# Patient Record
Sex: Male | Born: 1970 | Race: White | Hispanic: No | Marital: Married | Smoking: Never smoker
Health system: Southern US, Community
[De-identification: ages and names within clinical notes are randomized; demographics above are authoritative.]

## PROBLEM LIST (undated history)

## (undated) HISTORY — PX: HERNIA REPAIR: SHX51

---

## 2010-10-04 ENCOUNTER — Observation Stay: Payer: Self-pay | Admitting: Internal Medicine

## 2015-11-22 ENCOUNTER — Emergency Department: Payer: Commercial Managed Care - HMO

## 2015-11-22 ENCOUNTER — Emergency Department
Admission: EM | Admit: 2015-11-22 | Discharge: 2015-11-22 | Disposition: A | Discharge status: Home / Self Care | Payer: Commercial Managed Care - HMO | Attending: Emergency Medicine | Admitting: Emergency Medicine

## 2015-11-22 DIAGNOSIS — W2189XA Striking against or struck by other sports equipment, initial encounter: Secondary | ICD-10-CM | POA: Insufficient documentation

## 2015-11-22 DIAGNOSIS — S060X0A Concussion without loss of consciousness, initial encounter: Secondary | ICD-10-CM | POA: Diagnosis not present

## 2015-11-22 DIAGNOSIS — Y9353 Activity, golf: Secondary | ICD-10-CM | POA: Insufficient documentation

## 2015-11-22 DIAGNOSIS — S0990XA Unspecified injury of head, initial encounter: Secondary | ICD-10-CM | POA: Diagnosis present

## 2015-11-22 DIAGNOSIS — Y929 Unspecified place or not applicable: Secondary | ICD-10-CM | POA: Insufficient documentation

## 2015-11-22 DIAGNOSIS — Y999 Unspecified external cause status: Secondary | ICD-10-CM | POA: Insufficient documentation

## 2015-11-22 MED ORDER — PROCHLORPERAZINE MALEATE 10 MG PO TABS
10.0000 mg | ORAL_TABLET | Freq: Once | ORAL | Status: AC
  Administered 2015-11-22: 10 mg via ORAL
  Filled 2015-11-22: qty 1
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
## 2015-11-22 NOTE — ED Notes (Signed)
Pt. Verbalizes understanding of d/c instructions, and follow-up. VS stable and pain controlled per pt.  Pt. In NAD at time of d/c and denies further concerns regarding this visit. Pt. Stable at the time of departure from the unit, departing unit by the safest and most appropriate manner per that pt condition and limitations. Pt advised to return to the ED at any time for emergent concerns, or for new/worsening symptoms.   

## 2015-11-22 NOTE — ED Notes (Signed)
Pt. Reports Rt. Sided weakness, light sensitive but denies vision changes
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
## 2016-05-09 DIAGNOSIS — R5383 Other fatigue: Secondary | ICD-10-CM | POA: Diagnosis not present

## 2016-05-09 DIAGNOSIS — J309 Allergic rhinitis, unspecified: Secondary | ICD-10-CM | POA: Diagnosis not present

## 2016-05-09 DIAGNOSIS — E538 Deficiency of other specified B group vitamins: Secondary | ICD-10-CM | POA: Diagnosis not present

## 2016-05-09 DIAGNOSIS — R5381 Other malaise: Secondary | ICD-10-CM | POA: Diagnosis not present

## 2016-05-09 DIAGNOSIS — R05 Cough: Secondary | ICD-10-CM | POA: Diagnosis not present

## 2016-05-19 DIAGNOSIS — J301 Allergic rhinitis due to pollen: Secondary | ICD-10-CM | POA: Diagnosis not present

## 2016-05-19 DIAGNOSIS — K219 Gastro-esophageal reflux disease without esophagitis: Secondary | ICD-10-CM | POA: Diagnosis not present

## 2016-05-19 DIAGNOSIS — R05 Cough: Secondary | ICD-10-CM | POA: Diagnosis not present

## 2016-08-02 DIAGNOSIS — M791 Myalgia: Secondary | ICD-10-CM | POA: Diagnosis not present

## 2016-08-02 DIAGNOSIS — R509 Fever, unspecified: Secondary | ICD-10-CM | POA: Diagnosis not present

## 2016-11-06 DIAGNOSIS — E785 Hyperlipidemia, unspecified: Secondary | ICD-10-CM | POA: Diagnosis not present

## 2016-11-06 DIAGNOSIS — Z0000 Encounter for general adult medical examination without abnormal findings: Secondary | ICD-10-CM | POA: Diagnosis not present

## 2016-12-21 DIAGNOSIS — Z23 Encounter for immunization: Secondary | ICD-10-CM | POA: Diagnosis not present

## 2017-05-04 DIAGNOSIS — J0100 Acute maxillary sinusitis, unspecified: Secondary | ICD-10-CM | POA: Diagnosis not present

## 2017-05-19 ENCOUNTER — Emergency Department: Payer: 59

## 2017-05-19 ENCOUNTER — Emergency Department
Admission: EM | Admit: 2017-05-19 | Discharge: 2017-05-19 | Disposition: A | Discharge status: Home / Self Care | Payer: 59 | Attending: Emergency Medicine | Admitting: Emergency Medicine

## 2017-05-19 ENCOUNTER — Other Ambulatory Visit: Payer: Self-pay

## 2017-05-19 DIAGNOSIS — Y9301 Activity, walking, marching and hiking: Secondary | ICD-10-CM | POA: Diagnosis not present

## 2017-05-19 DIAGNOSIS — Y929 Unspecified place or not applicable: Secondary | ICD-10-CM | POA: Insufficient documentation

## 2017-05-19 DIAGNOSIS — Y999 Unspecified external cause status: Secondary | ICD-10-CM | POA: Insufficient documentation

## 2017-05-19 DIAGNOSIS — S3992XA Unspecified injury of lower back, initial encounter: Secondary | ICD-10-CM | POA: Diagnosis present

## 2017-05-19 DIAGNOSIS — M25552 Pain in left hip: Secondary | ICD-10-CM | POA: Insufficient documentation

## 2017-05-19 DIAGNOSIS — S300XXA Contusion of lower back and pelvis, initial encounter: Secondary | ICD-10-CM | POA: Diagnosis not present

## 2017-05-19 DIAGNOSIS — S79912A Unspecified injury of left hip, initial encounter: Secondary | ICD-10-CM | POA: Diagnosis not present

## 2017-05-19 DIAGNOSIS — W19XXXA Unspecified fall, initial encounter: Secondary | ICD-10-CM

## 2017-05-19 DIAGNOSIS — W108XXA Fall (on) (from) other stairs and steps, initial encounter: Secondary | ICD-10-CM | POA: Insufficient documentation

## 2017-05-19 LAB — URINALYSIS, COMPLETE (UACMP) WITH MICROSCOPIC
BACTERIA UA: NONE SEEN
Bilirubin Urine: NEGATIVE
Glucose, UA: NEGATIVE mg/dL
Hgb urine dipstick: NEGATIVE
Ketones, ur: NEGATIVE mg/dL
Leukocytes, UA: NEGATIVE
NITRITE: NEGATIVE
Protein, ur: NEGATIVE mg/dL
RBC / HPF: NONE SEEN RBC/hpf (ref 0–5)
SPECIFIC GRAVITY, URINE: 1.02 (ref 1.005–1.030)
SQUAMOUS EPITHELIAL / LPF: NONE SEEN
pH: 5 (ref 5.0–8.0)

## 2017-05-19 MED ORDER — HYDROCODONE-ACETAMINOPHEN 5-325 MG PO TABS: 1.0000 | ORAL_TABLET | ORAL | 0 refills | Status: DC | PRN

## 2017-05-19 MED ORDER — METHOCARBAMOL 500 MG PO TABS: 500.0000 mg | ORAL_TABLET | Freq: Four times a day (QID) | ORAL | 0 refills | Status: AC

## 2017-05-19 MED ORDER — MELOXICAM 15 MG PO TABS: 15.0000 mg | ORAL_TABLET | Freq: Every day | ORAL | 0 refills | Status: AC

## 2017-05-19 MED ORDER — OXYCODONE-ACETAMINOPHEN 5-325 MG PO TABS
1.0000 | ORAL_TABLET | Freq: Once | ORAL | Status: AC
  Administered 2017-05-19: 1 via ORAL
  Filled 2017-05-19: qty 1

## 2017-05-19 MED ORDER — KETOROLAC TROMETHAMINE 30 MG/ML IJ SOLN
30.0000 mg | Freq: Once | INTRAMUSCULAR | Status: AC
  Administered 2017-05-19: 30 mg via INTRAMUSCULAR
  Filled 2017-05-19: qty 1

## 2017-05-19 MED ORDER — ONDANSETRON 8 MG PO TBDP
8.0000 mg | ORAL_TABLET | Freq: Once | ORAL | Status: AC
  Administered 2017-05-19: 8 mg via ORAL
  Filled 2017-05-19: qty 1

## 2017-05-19 MED ORDER — METHOCARBAMOL 500 MG PO TABS
1000.0000 mg | ORAL_TABLET | Freq: Once | ORAL | Status: AC
  Administered 2017-05-19: 1000 mg via ORAL
  Filled 2017-05-19: qty 2

## 2017-05-19 NOTE — ED Triage Notes (Signed)
Pt arrives to ED ambulatory. Fell on wooden steps earlier today and hit lower back. Denies hitting head, denies LOC. Denies blood thinner use. Appears uncomfortable. Unsure if hit pelvis.

## 2017-05-19 NOTE — ED Notes (Signed)
Pt discharged to home.  Family member driving.  Discharge instructions reviewed.  Verbalized understanding.  No questions or concerns at this time.  Teach back verified.  Pt in NAD.  No items left in ED.   

## 2017-05-19 NOTE — ED Provider Notes (Signed)
[facility] Emergency Department Provider Note  ____________________________________________  Time seen: Approximately 5:30 PM  I have reviewed the triage vital signs and the nursing notes.   HISTORY  Chief Complaint Fall    HPI Brian Perez is a 47 y.o. male who presents the emergency department complaining of lower back and left hip pain status post a fall.  Patient was coming down a flight of wooden stairs when he slipped and landed on his lower back and left hip.  At this time, patient reports sharp pain to the lower back and left hip but denies any radicular symptoms.  No bowel or bladder dysfunction, saddle anesthesia, paresthesias.  Patient did not hit his head or lose consciousness.  Patient denies any urinary retention symptoms.  He denies any hematuria.  No medications for this complaint prior to arrival.  No history of previous back injuries.  History reviewed. No pertinent past medical history.  There are no active problems to display for this patient.   Past Surgical History:  Procedure Laterality Date  . HERNIA REPAIR      Prior to Admission medications   Medication Sig Start Date End Date Taking? Authorizing Provider  HYDROcodone-acetaminophen (NORCO/VICODIN) 5-325 MG tablet Take 1 tablet by mouth every 4 (four) hours as needed for moderate pain. 05/19/17   Cuthriell, Charline Bills, PA-C  meloxicam (MOBIC) 15 MG tablet Take 1 tablet (15 mg total) by mouth daily. 05/19/17   Cuthriell, Charline Bills, PA-C  methocarbamol (ROBAXIN) 500 MG tablet Take 1 tablet (500 mg total) by mouth 4 (four) times daily. 05/19/17   Cuthriell, Charline Bills, PA-C    Allergies Patient has no known allergies.  History reviewed. No pertinent family history.  Social History Social History   Tobacco Use  . Smoking status: Never Smoker  Substance Use Topics  . Alcohol use: No    Frequency: Never  . Drug use: Not on file     Review of Systems  Constitutional: No  fever/chills Eyes: No visual changes. No discharge ENT: No upper respiratory complaints. Cardiovascular: no chest pain. Respiratory: no cough. No SOB. Gastrointestinal: No abdominal pain.  No nausea, no vomiting.  No diarrhea.  No constipation. Genitourinary: Negative for dysuria. No hematuria Musculoskeletal: Positive for lower back pain and left hip pain. Skin: Negative for rash, abrasions, lacerations, ecchymosis. Neurological: Negative for headaches, focal weakness or numbness. 10-point ROS otherwise negative.  ____________________________________________   PHYSICAL EXAM:  VITAL SIGNS: ED Triage Vitals  Enc Vitals Group     BP 05/19/17 1504 (!) 130/91     Pulse Rate 05/19/17 1504 (!) 107     Resp 05/19/17 1504 18     Temp 05/19/17 1504 97.9 F (36.6 C)     Temp Source 05/19/17 1504 Oral     SpO2 05/19/17 1504 95 %     Weight 05/19/17 1505 182 lb (82.6 kg)     Height 05/19/17 1505 5\' 10"  (1.778 m)     Head Circumference --      Peak Flow --      Pain Score 05/19/17 1504 10     Pain Loc --      Pain Edu? --      Excl. in [place]? --      Constitutional: Alert and oriented. Well appearing and in no acute distress. Eyes: Conjunctivae are normal. PERRL. EOMI. Head: Atraumatic. Neck: No stridor.  No cervical spine tenderness to palpation. Cardiovascular: Normal rate, regular rhythm. Normal S1 and S2.  Good peripheral circulation. Respiratory: Normal respiratory effort without tachypnea or retractions. Lungs CTAB. Good air entry to the bases with no decreased or absent breath sounds. Musculoskeletal: Full range of motion to all extremities. No gross deformities appreciated.  No deformity, abrasion, laceration, hematoma noted to the lower spine region.  Full range of motion to the lumbar spine.  Diffuse tenderness to palpation midline and and left side paraspinal muscle region.  No palpable abnormality or step-off.  Patient is tender to palpation along the iliac crest and over the  greater trochanteric region with no palpable abnormality.  Full range of motion to the left hip, left knee, left ankle joint.  Dorsalis pedis pulse intact bilateral lower extremity bilateral lower extremities.  Negative straight leg raise bilaterally. Neurologic:  Normal speech and language. No gross focal neurologic deficits are appreciated.  Skin:  Skin is warm, dry and intact. No rash noted. Psychiatric: Mood and affect are normal. Speech and behavior are normal. Patient exhibits appropriate insight and judgement.   ____________________________________________   LABS (all labs ordered are listed, but only abnormal results are displayed)  Labs Reviewed  URINALYSIS, COMPLETE (UACMP) WITH MICROSCOPIC - Abnormal; Notable for the following components:      Result Value   Color, Urine YELLOW (*)    APPearance CLEAR (*)    All other components within normal limits   ____________________________________________  EKG   ____________________________________________  RADIOLOGY Diamantina Providence Cuthriell, personally viewed and evaluated these images (plain radiographs) as part of my medical decision making, as well as reviewing the written report by the radiologist.  Dg Lumbar Spine Complete  Result Date: 05/19/2017 CLINICAL DATA:  Fall onto lower back EXAM: LUMBAR SPINE - COMPLETE 4+ VIEW COMPARISON:  None. FINDINGS: Five lumbar-type vertebral bodies. Normal lumbar lordosis. No evidence of fracture or dislocation. Vertebral body heights are maintained. Mild degenerative changes at L5-S1. Bilateral pars defects are possible at this level, without anterolisthesis. IMPRESSION: Mild degenerative changes at L5-S1. Electronically Signed   By: Julian Hy M.D.   On: 05/19/2017 16:01   Dg Hip Unilat W Or Wo Pelvis 2-3 Views Left  Result Date: 05/19/2017 CLINICAL DATA:  Patient status post fall. Lower back pain. Left hip pain. EXAM: DG HIP (WITH OR WITHOUT PELVIS) 2-3V LEFT COMPARISON:  None.  FINDINGS: There is no evidence of hip fracture or dislocation. There is no evidence of arthropathy or other focal bone abnormality. IMPRESSION: No acute osseous abnormality. Electronically Signed   By: Lovey Newcomer M.D.   On: 05/19/2017 17:05    ____________________________________________    PROCEDURES  Procedure(s) performed:    Procedures    Medications  ketorolac (TORADOL) 30 MG/ML injection 30 mg (not administered)  methocarbamol (ROBAXIN) tablet 1,000 mg (not administered)  oxyCODONE-acetaminophen (PERCOCET/ROXICET) 5-325 MG per tablet 1 tablet (1 tablet Oral Given 05/19/17 1656)  ondansetron (ZOFRAN-ODT) disintegrating tablet 8 mg (8 mg Oral Given 05/19/17 1656)     ____________________________________________   INITIAL IMPRESSION / ASSESSMENT AND PLAN / ED COURSE  Pertinent labs & imaging results that were available during my care of the patient were reviewed by me and considered in my medical decision making (see chart for details).  Review of the [place] CSRS was performed in accordance of the [place] prior to dispensing any controlled drugs.     Patient's diagnosis is consistent with fall resulting in lumbar contusion.  Initial differential included contusion, fractures, ruptured disc.  Patient's exam was overall reassuring.  Imaging reveals no acute osseous abnormality consistent  with fractures.  At this time, no indication for further workup.. Patient will be discharged home with prescriptions for meloxicam, Robaxin, #12 Vicodin. Patient is to follow up with primary care as needed or otherwise directed. Patient is given ED precautions to return to the ED for any worsening or new symptoms.     ____________________________________________  FINAL CLINICAL IMPRESSION(S) / ED DIAGNOSES  Final diagnoses:  Fall, initial encounter  Lumbar contusion, initial encounter      NEW MEDICATIONS STARTED DURING THIS VISIT:  ED Discharge Orders        Ordered    meloxicam  (MOBIC) 15 MG tablet  Daily     05/19/17 1759    methocarbamol (ROBAXIN) 500 MG tablet  4 times daily     05/19/17 1759    HYDROcodone-acetaminophen (NORCO/VICODIN) 5-325 MG tablet  Every 4 hours PRN     05/19/17 1759          This chart was dictated using voice recognition software/Dragon. Despite best efforts to proofread, errors can occur which can change the meaning. Any change was purely unintentional.    Darletta Moll, PA-C 05/19/17 1802    Carrie Mew, MD 05/20/17 [phone]

## 2017-07-10 DIAGNOSIS — R066 Hiccough: Secondary | ICD-10-CM | POA: Diagnosis not present

## 2017-11-05 ENCOUNTER — Emergency Department
Admission: EM | Admit: 2017-11-05 | Discharge: 2017-11-05 | Disposition: A | Discharge status: Home / Self Care | Payer: Commercial Managed Care - HMO | Attending: Emergency Medicine | Admitting: Emergency Medicine

## 2017-11-05 ENCOUNTER — Emergency Department: Payer: Commercial Managed Care - HMO

## 2017-11-05 ENCOUNTER — Other Ambulatory Visit: Payer: Self-pay

## 2017-11-05 ENCOUNTER — Encounter: Payer: Self-pay | Admitting: Emergency Medicine

## 2017-11-05 DIAGNOSIS — Z13220 Encounter for screening for lipoid disorders: Secondary | ICD-10-CM | POA: Diagnosis not present

## 2017-11-05 DIAGNOSIS — R42 Dizziness and giddiness: Secondary | ICD-10-CM | POA: Insufficient documentation

## 2017-11-05 DIAGNOSIS — Z79899 Other long term (current) drug therapy: Secondary | ICD-10-CM | POA: Insufficient documentation

## 2017-11-05 DIAGNOSIS — R51 Headache: Secondary | ICD-10-CM

## 2017-11-05 DIAGNOSIS — H538 Other visual disturbances: Secondary | ICD-10-CM | POA: Insufficient documentation

## 2017-11-05 DIAGNOSIS — G44219 Episodic tension-type headache, not intractable: Secondary | ICD-10-CM | POA: Insufficient documentation

## 2017-11-05 DIAGNOSIS — J0100 Acute maxillary sinusitis, unspecified: Secondary | ICD-10-CM

## 2017-11-05 DIAGNOSIS — R519 Headache, unspecified: Secondary | ICD-10-CM

## 2017-11-05 DIAGNOSIS — Z130 Encounter for screening for diseases of the blood and blood-forming organs and certain disorders involving the immune mechanism: Secondary | ICD-10-CM | POA: Diagnosis not present

## 2017-11-05 DIAGNOSIS — G43109 Migraine with aura, not intractable, without status migrainosus: Secondary | ICD-10-CM | POA: Diagnosis not present

## 2017-11-05 LAB — CBC
HEMATOCRIT: 45.2 % (ref 40.0–52.0)
Hemoglobin: 15.8 g/dL (ref 13.0–18.0)
MCH: 29.1 pg (ref 26.0–34.0)
MCHC: 35 g/dL (ref 32.0–36.0)
MCV: 83.1 fL (ref 80.0–100.0)
Platelets: 238 10*3/uL (ref 150–440)
RBC: 5.44 MIL/uL (ref 4.40–5.90)
RDW: 13.7 % (ref 11.5–14.5)
WBC: 14.1 10*3/uL — ABNORMAL HIGH (ref 3.8–10.6)

## 2017-11-05 LAB — COMPREHENSIVE METABOLIC PANEL
ALK PHOS: 50 U/L (ref 38–126)
ALT: 39 U/L (ref 0–44)
ANION GAP: 6 (ref 5–15)
AST: 29 U/L (ref 15–41)
Albumin: 4.6 g/dL (ref 3.5–5.0)
BILIRUBIN TOTAL: 1.2 mg/dL (ref 0.3–1.2)
BUN: 13 mg/dL (ref 6–20)
CALCIUM: 9.3 mg/dL (ref 8.9–10.3)
CO2: 27 mmol/L (ref 22–32)
Chloride: 104 mmol/L (ref 98–111)
Creatinine, Ser: 1.02 mg/dL (ref 0.61–1.24)
Glucose, Bld: 120 mg/dL — ABNORMAL HIGH (ref 70–99)
POTASSIUM: 4 mmol/L (ref 3.5–5.1)
Sodium: 137 mmol/L (ref 135–145)
TOTAL PROTEIN: 7.8 g/dL (ref 6.5–8.1)

## 2017-11-05 MED ORDER — PROCHLORPERAZINE EDISYLATE 10 MG/2ML IJ SOLN
10.0000 mg | Freq: Once | INTRAMUSCULAR | Status: AC
  Administered 2017-11-05: 10 mg via INTRAVENOUS
  Filled 2017-11-05: qty 2

## 2017-11-05 MED ORDER — LORAZEPAM 2 MG/ML IJ SOLN
1.0000 mg | Freq: Once | INTRAMUSCULAR | Status: AC
  Administered 2017-11-05: 1 mg via INTRAVENOUS
  Filled 2017-11-05: qty 1

## 2017-11-05 MED ORDER — DEXAMETHASONE SODIUM PHOSPHATE 10 MG/ML IJ SOLN
10.0000 mg | Freq: Once | INTRAMUSCULAR | Status: AC
  Administered 2017-11-05: 10 mg via INTRAVENOUS
  Filled 2017-11-05: qty 1

## 2017-11-05 MED ORDER — MAGNESIUM SULFATE 2 GM/50ML IV SOLN
2.0000 g | Freq: Once | INTRAVENOUS | Status: AC
Start: 2017-11-05 — End: 2017-11-05
  Administered 2017-11-05: 2 g via INTRAVENOUS
  Filled 2017-11-05: qty 50

## 2017-11-05 MED ORDER — DIPHENHYDRAMINE HCL 50 MG/ML IJ SOLN
25.0000 mg | Freq: Once | INTRAMUSCULAR | Status: AC
  Administered 2017-11-05: 25 mg via INTRAVENOUS
  Filled 2017-11-05: qty 1

## 2017-11-05 NOTE — Discharge Instructions (Addendum)
I believe you had a migraine headache.  Please follow-up with Dr. Kary Kos as we discussed.  If you begin to get another headache at the very beginning of the headache you can take some Tylenol, 4 of the over-the-counter Motrin's and a cup of coffee or can of Coca-Cola.  Take these all at once.  Oftentimes I will be enough to get rid of a headache.  If the headaches come frequently or if this does not work please return to follow-up with Dr. Kary Kos again.  He may decide to send you to neurology if need be.  These return here at once if you develop a headache with fever or begin vomiting and cannot keep anything down.  Use over-the-counter salt water nasal spray 5 or 6 times a day to help shrink the swelling and help with sinuses.  You can also use an over-the-counter decongestant for 1 or 2 days.  If you are still having any pain or pressure in that area after a week or if you develop a fever redness or swelling please return here or see Dr. Kary Kos.

## 2017-11-05 NOTE — ED Triage Notes (Signed)
Pt wife reports yesterday the patient started seeing white spots and then got a headache. Pt reports took ibuprofen last pm and this am his headache was back. Pt followed up with [facility] in [place] today and was told all looked good but if something else happened to go to the ED. Pt reports he had another episode at work. Pt reports today he started with dizziness, NV and a stiff neck and he was seeing multiple colors not just white.

## 2017-11-05 NOTE — ED Notes (Signed)
Pt given Ativan and taken to MRI.

## 2017-11-05 NOTE — ED Triage Notes (Signed)
First Nurse Note:  C/o headache today and dizziness.  Patient is AAOx3. Skin warm and dry.  Ambulates with easy and steady gait.  NAD

## 2017-11-05 NOTE — ED Provider Notes (Signed)
[facility] Emergency Department Provider Note  ____________________________________________   First MD Initiated Contact with Patient 11/05/17 1621     (approximate)  I have reviewed the triage vital signs and the nursing notes.   HISTORY  Chief Complaint Headache    HPI Brian Perez is a 47 y.o. male she reports he had white spots in front of his eyes yesterday and then developed rapid onset of a headache mostly consist concentrated in the right temple.  This improved over the course of the night but today he began having kaleidoscope colored lights and then developed another headache headache has been waxing and waning during the course of the day.  He is also been having nausea and vomiting with it.  The headache is made somewhat worse by very bright lights he has not had any response to noise.  It improved overnight with sleep.  He has not had this before.  His mother does have a history of infrequent migraines.   History reviewed. No pertinent past medical history.  There are no active problems to display for this patient.   Past Surgical History:  Procedure Laterality Date  . HERNIA REPAIR      Prior to Admission medications   Medication Sig Start Date End Date Taking? Authorizing Provider  HYDROcodone-acetaminophen (NORCO/VICODIN) 5-325 MG tablet Take 1 tablet by mouth every 4 (four) hours as needed for moderate pain. 05/19/17   Cuthriell, Charline Bills, PA-C  meloxicam (MOBIC) 15 MG tablet Take 1 tablet (15 mg total) by mouth daily. 05/19/17   Cuthriell, Charline Bills, PA-C  methocarbamol (ROBAXIN) 500 MG tablet Take 1 tablet (500 mg total) by mouth 4 (four) times daily. 05/19/17   Cuthriell, Charline Bills, PA-C    Allergies Patient has no known allergies.  No family history on file.  Social History Social History   Tobacco Use  . Smoking status: Never Smoker  Substance Use Topics  . Alcohol use: No    Frequency: Never  . Drug use: Not on file     Review of Systems  Constitutional: No fever/chills Eyes: No visual changes. ENT: No sore throat. Cardiovascular: Denies chest pain. Respiratory: Denies shortness of breath. Gastrointestinal: No abdominal pain.  No nausea, no vomiting.  No diarrhea.  No constipation. Genitourinary: Negative for dysuria. Musculoskeletal: Negative for back pain. Skin: Negative for rash. Neurological: Negative for  focal weakness or numbness.   ____________________________________________   PHYSICAL EXAM:  VITAL SIGNS: ED Triage Vitals [11/05/17 1448]  Enc Vitals Group     BP 134/84     Pulse Rate 78     Resp 20     Temp 98.7 F (37.1 C)     Temp Source Oral     SpO2 98 %     Weight 194 lb (88 kg)     Height 5\' 10"  (1.778 m)     Head Circumference      Peak Flow      Pain Score 4     Pain Loc      Pain Edu?      Excl. in [place]?     Constitutional: Alert and oriented. Well appearing and in no acute distress. Eyes: Conjunctivae are normal. PERRL. EOMI. fundi are difficult to see but appear normal Head: Atraumatic.  No tenderness to palpation of the temples Nose: No congestion/rhinnorhea. Mouth/Throat: Mucous membranes are moist.  Oropharynx non-erythematous. Neck: No stridor. Cardiovascular: Normal rate, regular rhythm. Grossly normal heart sounds.  Good peripheral circulation.  Respiratory: Normal respiratory effort.  No retractions. Lungs CTAB. Gastrointestinal: Soft and nontender. No distention. No abdominal bruits. No CVA tenderness. Musculoskeletal: No lower extremity tenderness nor edema.  No joint effusions. Neurologic:  Normal speech and language. No gross focal neurologic deficits are appreciated.  Pacifically cranial nerves II through XII are intact of the visual fields were not checked finger-nose rapid alternating movements and hands and heel-to-shin are all normal motor strength is 5/5 throughout there is no numbness. Skin:  Skin is warm, dry and intact. No rash  noted. Psychiatric: Mood and affect are normal. Speech and behavior are normal.  ____________________________________________   LABS (all labs ordered are listed, but only abnormal results are displayed)  Labs Reviewed  CBC - Abnormal; Notable for the following components:      Result Value   WBC 14.1 (*)    All other components within normal limits  COMPREHENSIVE METABOLIC PANEL - Abnormal; Notable for the following components:   Glucose, Bld 120 (*)    All other components within normal limits   ____________________________________________  EKG  EKG read interpreted by me shows normal sinus rhythm rate of 92 left axis no acute ST-T wave changes ____________________________________________  RADIOLOGY  ED MD interpretation: MRI shows nothing but some mild sinusitis  Official radiology report(s): Mr [name] NI Contrast  Result Date: 11/05/2017 CLINICAL DATA:  48 y/o M; episodes of headache, dizziness, and visual disturbance. EXAM: MRI HEAD WITHOUT CONTRAST MRA HEAD WITHOUT CONTRAST TECHNIQUE: Multiplanar, multiecho pulse sequences of the brain and surrounding structures were obtained without intravenous contrast. Angiographic images of the head were obtained using MRA technique without contrast. COMPARISON:  11/22/2015 CT head FINDINGS: MRI HEAD FINDINGS Brain: No acute infarction, hemorrhage, hydrocephalus, extra-axial collection or mass lesion. Prominent retrocerebellar extra-axial space may represent a small arachnoid cyst or mega cisterna magna, no significant mass effect. Vascular: Normal flow voids. Skull and upper cervical spine: Normal marrow signal. Sinuses/Orbits: Mild left maxillary sinus mucosal thickening. Small right maxillary sinus mucous retention cyst. No additional signal abnormality of paranasal sinuses or mastoid air cells. Orbits are unremarkable. Other: None. MRA HEAD FINDINGS Internal carotid arteries:  Patent. Anterior cerebral arteries:  Patent. Middle cerebral  arteries: Patent. Anterior communicating artery: Patent. Posterior communicating arteries: Not identified, likely hypoplastic or absent. Posterior cerebral arteries:  Patent. Basilar artery:  Patent.  Lower basilar fenestration. Vertebral arteries:  Patent. No evidence of high-grade stenosis, large vessel occlusion, or aneurysm. IMPRESSION: 1. No acute intracranial abnormality. Unremarkable MRI of the brain. 2. Mild maxillary sinus disease. 3. Normal MRA of the head. Electronically Signed   By: Kristine Garbe M.D.   On: 11/05/2017 19:26   Mr Brain Wo Contrast  Result Date: 11/05/2017 CLINICAL DATA:  47 y/o M; episodes of headache, dizziness, and visual disturbance. EXAM: MRI HEAD WITHOUT CONTRAST MRA HEAD WITHOUT CONTRAST TECHNIQUE: Multiplanar, multiecho pulse sequences of the brain and surrounding structures were obtained without intravenous contrast. Angiographic images of the head were obtained using MRA technique without contrast. COMPARISON:  11/22/2015 CT head FINDINGS: MRI HEAD FINDINGS Brain: No acute infarction, hemorrhage, hydrocephalus, extra-axial collection or mass lesion. Prominent retrocerebellar extra-axial space may represent a small arachnoid cyst or mega cisterna magna, no significant mass effect. Vascular: Normal flow voids. Skull and upper cervical spine: Normal marrow signal. Sinuses/Orbits: Mild left maxillary sinus mucosal thickening. Small right maxillary sinus mucous retention cyst. No additional signal abnormality of paranasal sinuses or mastoid air cells. Orbits are unremarkable. Other: None. MRA HEAD FINDINGS Internal carotid  arteries:  Patent. Anterior cerebral arteries:  Patent. Middle cerebral arteries: Patent. Anterior communicating artery: Patent. Posterior communicating arteries: Not identified, likely hypoplastic or absent. Posterior cerebral arteries:  Patent. Basilar artery:  Patent.  Lower basilar fenestration. Vertebral arteries:  Patent. No evidence of  high-grade stenosis, large vessel occlusion, or aneurysm. IMPRESSION: 1. No acute intracranial abnormality. Unremarkable MRI of the brain. 2. Mild maxillary sinus disease. 3. Normal MRA of the head. Electronically Signed   By: Kristine Garbe M.D.   On: 11/05/2017 19:26    ____________________________________________   PROCEDURES  Procedure(s) performed:   Procedures  Critical Care performed:   ____________________________________________   INITIAL IMPRESSION / ASSESSMENT AND PLAN / ED COURSE Patient headache resolved.  Patient does have side effect with Compazine and cannot stop moving and feels jittery.  We will give him some Benadryl and some Ativan for the MRI.  MRI returns is essentially normal I discussed treatment of his sinusitis with over-the-counter decongestants and salt water nasal spray and he will return or see his doctor he is not well in about a week.  We can try antibiotics then.      Clinical Course as of Nov 06 1954  Mon Nov 05, 2017  1825 Calcium: 9.3 [PM]  1924 Albumin: 4.6 [PM]  1928 AST: 29 [PM]    Clinical Course User Index [PM] Nena Polio, MD     ____________________________________________   FINAL CLINICAL IMPRESSION(S) / ED DIAGNOSES  Final diagnoses:  Nonintractable episodic headache, unspecified headache type  Acute maxillary sinusitis, recurrence not specified     ED Discharge Orders    None       Note:  This document was prepared using Dragon voice recognition software and may include unintentional dictation errors.    Nena Polio, MD 11/05/17 [id number]

## 2017-11-06 DIAGNOSIS — G44009 Cluster headache syndrome, unspecified, not intractable: Secondary | ICD-10-CM | POA: Diagnosis not present

## 2017-11-09 DIAGNOSIS — E782 Mixed hyperlipidemia: Secondary | ICD-10-CM | POA: Diagnosis not present

## 2017-11-09 DIAGNOSIS — R7309 Other abnormal glucose: Secondary | ICD-10-CM | POA: Diagnosis not present

## 2017-11-09 DIAGNOSIS — Z0000 Encounter for general adult medical examination without abnormal findings: Secondary | ICD-10-CM | POA: Diagnosis not present

## 2018-01-12 DIAGNOSIS — Z23 Encounter for immunization: Secondary | ICD-10-CM | POA: Diagnosis not present

## 2019-03-11 ENCOUNTER — Ambulatory Visit: Payer: BC Managed Care – PPO | Attending: Internal Medicine

## 2019-03-11 DIAGNOSIS — Z20822 Contact with and (suspected) exposure to covid-19: Secondary | ICD-10-CM

## 2019-03-13 LAB — NOVEL CORONAVIRUS, NAA: SARS-CoV-2, NAA: NOT DETECTED

## 2019-11-30 IMAGING — MR MR MRA HEAD W/O CM
10 of 11 series · 30 of 48 positions shown · non-contrast
Comparison: 11/22/2015 CT head

CLINICAL DATA: 47 y/o M; episodes of headache, dizziness, and
visual disturbance.

EXAM:
MRI HEAD WITHOUT CONTRAST
MRA HEAD WITHOUT CONTRAST
TECHNIQUE: Multiplanar, multiecho pulse sequences of the brain and surrounding
structures were obtained without intravenous contrast. Angiographic
images of the head were obtained using MRA technique without
contrast.

[Series 2: ax dwi_tracew · axial · 3.0mm · 0.83mm/px · z∈[-76,+84]mm · 6 of 55 slices shown]
[im 1/55]
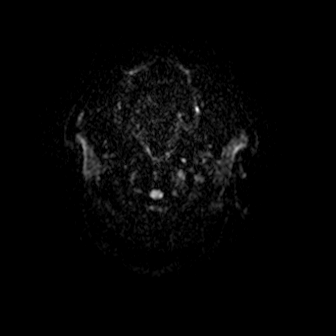
[im 11/55]
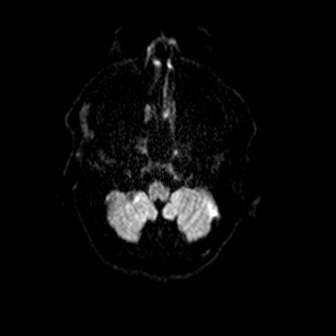
[im 22/55]
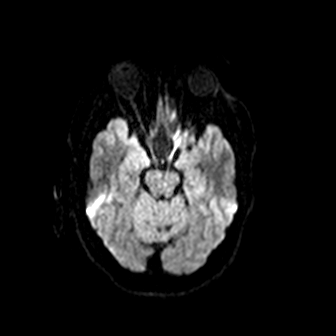
[im 33/55]
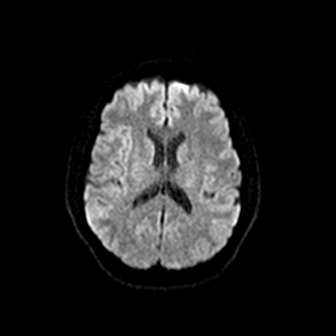
[im 44/55]
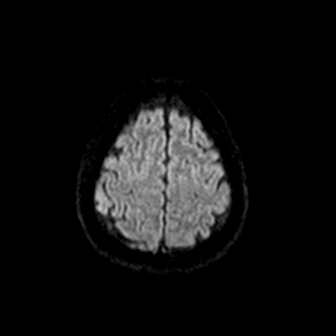
[im 55/55]
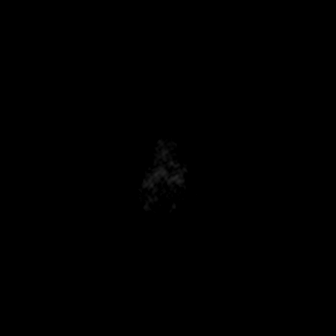

[Series 3: ax dwi_adc · axial · 3.0mm · 0.83mm/px · z∈[-76,+84]mm · 5 of 55 slices shown]
[im 1/55]
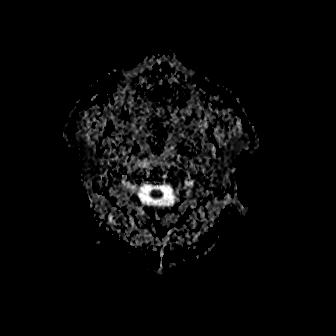
[im 14/55]
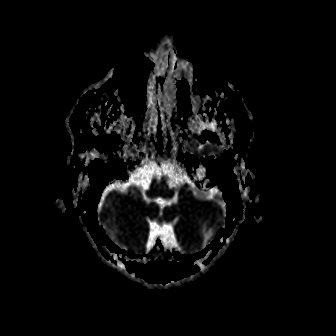
[im 28/55]
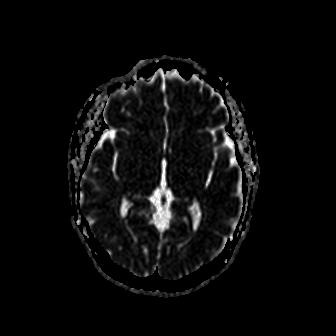
[im 41/55]
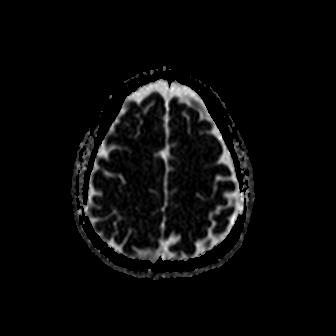
[im 55/55]
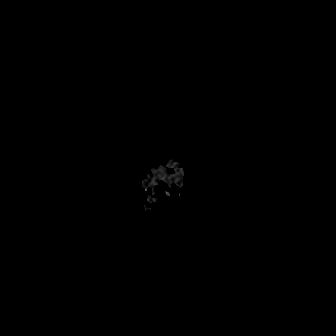

[Series 4: cor dwi_tracew · coronal · 5.0mm · 0.68mm/px · 3 of 41 slices shown]
[im 1/41]
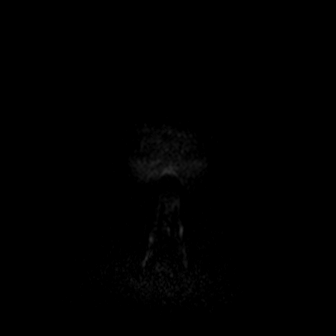
[im 21/41]
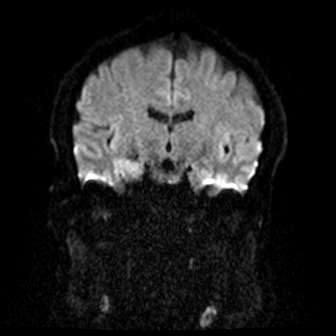
[im 41/41]
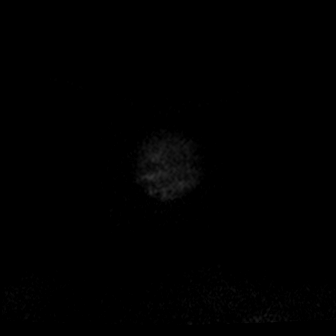

[Series 5: cor dwi_adc · coronal · 5.0mm · 0.68mm/px · 3 of 41 slices shown]
[im 1/41]
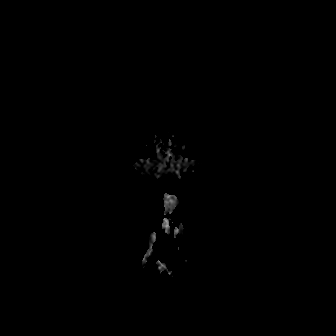
[im 21/41]
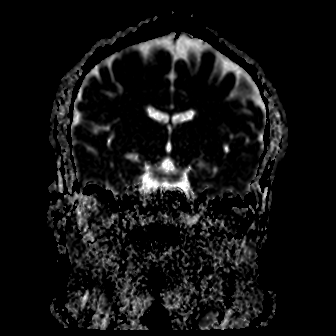
[im 41/41]
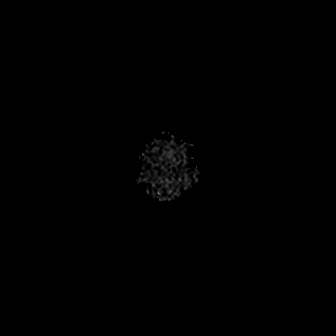

[Series 11: FLAIR · axial · 5.0mm · 1.20mm/px · z∈[-71,+84]mm · 2 of 27 slices shown]
[im 1/27]
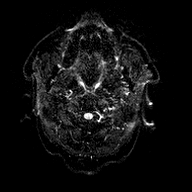
[im 27/27]
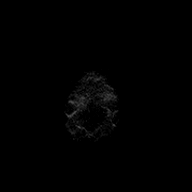

[Series 12: T2-star · axial · 5.0mm · 0.45mm/px · z∈[-71,+84]mm · 2 of 27 slices shown]
[im 1/27]
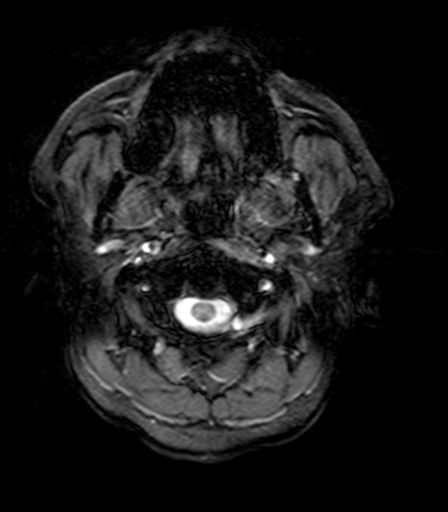
[im 27/27]
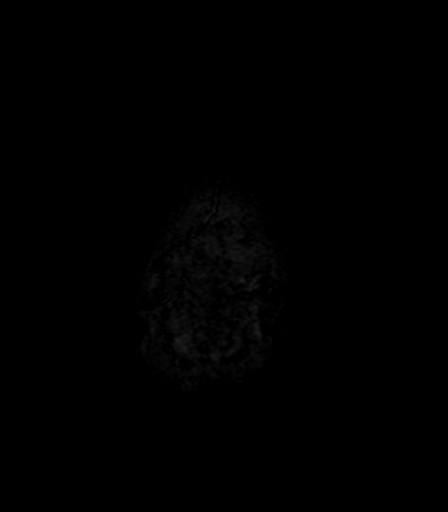

[Series 13: T2 · axial · 5.0mm · 0.45mm/px · z∈[-71,+84]mm · 2 of 27 slices shown (1 of 2)]
[im 1/27]
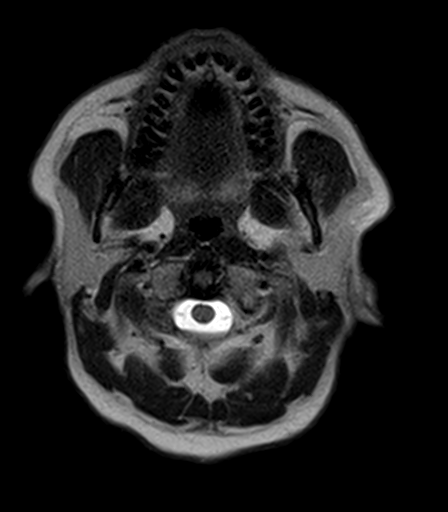
[im 27/27]
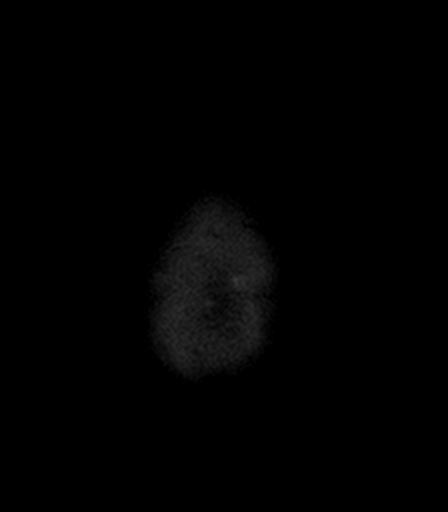

[Series 14: T1 · sagittal · 5.0mm · 0.94mm/px · 2 of 23 slices shown (1 of 2)]
[im 1/23]
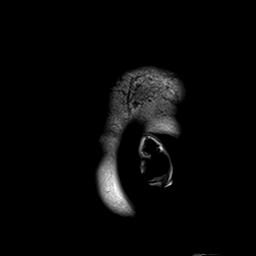
[im 23/23]
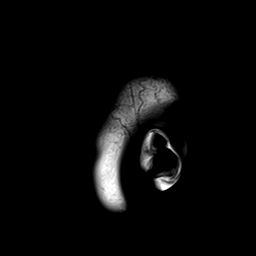

[Series 15: T1 · axial · 5.0mm · 0.90mm/px · z∈[-71,+84]mm · 2 of 27 slices shown (2 of 2)]
[im 1/27]
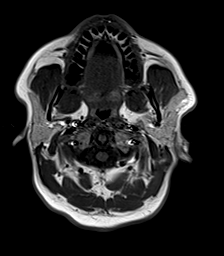
[im 27/27]
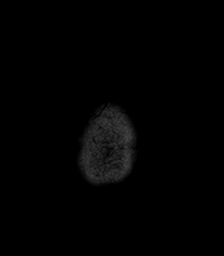

[Series 16: T2 · coronal · 5.0mm · 0.45mm/px · 3 of 31 slices shown (2 of 2)]
[im 1/31]
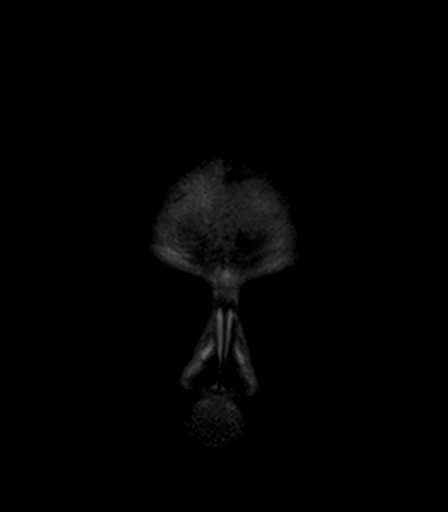
[im 16/31]
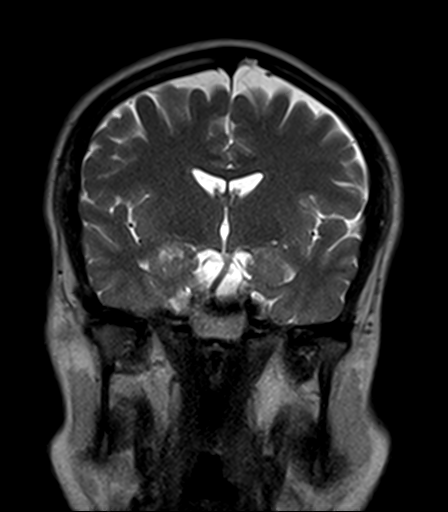
[im 31/31]
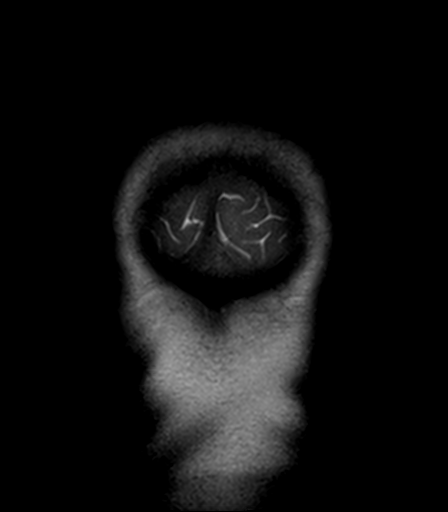

[30 of 48 positions shown; findings below may reference images not displayed]

FINDINGS: MRI HEAD FINDINGS

Brain: No acute infarction, hemorrhage, hydrocephalus, extra-axial
collection or mass lesion. Prominent retrocerebellar extra-axial
space may represent a small arachnoid cyst or mega cisterna magna,
no significant mass effect.

Vascular: Normal flow voids.

Skull and upper cervical spine: Normal marrow signal.

Sinuses/Orbits: Mild left maxillary sinus mucosal thickening. Small
right maxillary sinus mucous retention cyst. No additional signal
abnormality of paranasal sinuses or mastoid air cells. Orbits are
unremarkable.

Other: None.

MRA HEAD FINDINGS

Internal carotid arteries:  Patent.

Anterior cerebral arteries:  Patent.

Middle cerebral arteries: Patent.

Anterior communicating artery: Patent.

Posterior communicating arteries: Not identified, likely hypoplastic
or absent.

Posterior cerebral arteries:  Patent.

Basilar artery:  Patent.  Lower basilar fenestration.

Vertebral arteries:  Patent.

No evidence of high-grade stenosis, large vessel occlusion, or
aneurysm.
IMPRESSION: 1. No acute intracranial abnormality. Unremarkable MRI of the brain.
2. Mild maxillary sinus disease.
3. Normal MRA of the head.

By: Brendz Gepte M.D.

## 2020-11-26 ENCOUNTER — Emergency Department
Admission: EM | Admit: 2020-11-26 | Discharge: 2020-11-26 | Disposition: A | Discharge status: Home / Self Care | Payer: Managed Care, Other (non HMO) | Attending: Emergency Medicine | Admitting: Emergency Medicine

## 2020-11-26 ENCOUNTER — Encounter: Payer: Self-pay | Admitting: Emergency Medicine

## 2020-11-26 ENCOUNTER — Other Ambulatory Visit: Payer: Self-pay

## 2020-11-26 DIAGNOSIS — Y9289 Other specified places as the place of occurrence of the external cause: Secondary | ICD-10-CM | POA: Insufficient documentation

## 2020-11-26 DIAGNOSIS — Z23 Encounter for immunization: Secondary | ICD-10-CM | POA: Diagnosis not present

## 2020-11-26 DIAGNOSIS — Y9389 Activity, other specified: Secondary | ICD-10-CM | POA: Diagnosis not present

## 2020-11-26 DIAGNOSIS — Y990 Civilian activity done for income or pay: Secondary | ICD-10-CM | POA: Insufficient documentation

## 2020-11-26 DIAGNOSIS — W268XXA Contact with other sharp object(s), not elsewhere classified, initial encounter: Secondary | ICD-10-CM | POA: Insufficient documentation

## 2020-11-26 DIAGNOSIS — S60941A Unspecified superficial injury of left index finger, initial encounter: Secondary | ICD-10-CM | POA: Diagnosis present

## 2020-11-26 DIAGNOSIS — S61211A Laceration without foreign body of left index finger without damage to nail, initial encounter: Secondary | ICD-10-CM | POA: Insufficient documentation

## 2020-11-26 MED ORDER — LIDOCAINE HCL (PF) 1 % IJ SOLN
5.0000 mL | Freq: Once | INTRAMUSCULAR | Status: AC
  Administered 2020-11-26: 5 mL

## 2020-11-26 MED ORDER — TETANUS-DIPHTH-ACELL PERTUSSIS 5-2.5-18.5 LF-MCG/0.5 IM SUSY
0.5000 mL | PREFILLED_SYRINGE | Freq: Once | INTRAMUSCULAR | Status: AC
  Administered 2020-11-26: 0.5 mL via INTRAMUSCULAR
  Filled 2020-11-26: qty 0.5

## 2020-11-26 NOTE — ED Triage Notes (Signed)
Pt reports cut his left hand index finger with a razor blade about an hour ago. Pt reports he got it to stop bleeding but then he removed the bandage to put some neosporin on it and the bleeding started back

## 2020-11-26 NOTE — ED Provider Notes (Signed)
[facility] Emergency Department Provider Note  ____________________________________________   Event Date/Time   First MD Initiated Contact with Patient 11/26/20 1132     (approximate)  I have reviewed the triage vital signs and the nursing notes.   HISTORY  Chief Complaint Laceration   HPI Brian Perez is a 50 y.o. male without significant past medical history who presents for assessment of a laceration sustained to his left index finger while working prior to arrival.  Patient states he cut it with a razor he was using to scrape something.  He denies any other injuries or other acute complaints including fevers, chills, cough, nausea, vomiting, diarrhea, dysuria, rash or any other recent injuries or falls.  He denies any other cuts to his hands and is not sure when his last tetanus shot was.  No other acute concerns at this time.         History reviewed. No pertinent past medical history.  There are no problems to display for this patient.   Past Surgical History:  Procedure Laterality Date   HERNIA REPAIR      Prior to Admission medications   Medication Sig Start Date End Date Taking? Authorizing Provider  HYDROcodone-acetaminophen (NORCO/VICODIN) 5-325 MG tablet Take 1 tablet by mouth every 4 (four) hours as needed for moderate pain. 05/19/17   Cuthriell, Charline Bills, PA-C  meloxicam (MOBIC) 15 MG tablet Take 1 tablet (15 mg total) by mouth daily. 05/19/17   Cuthriell, Charline Bills, PA-C  methocarbamol (ROBAXIN) 500 MG tablet Take 1 tablet (500 mg total) by mouth 4 (four) times daily. 05/19/17   Cuthriell, Charline Bills, PA-C    Allergies Patient has no known allergies.  No family history on file.  Social History Social History   Tobacco Use   Smoking status: Never  Substance Use Topics   Alcohol use: No    Review of Systems  Review of Systems  Constitutional:  Negative for chills and fever.  HENT:  Negative for sore throat.   Eyes:   Negative for pain.  Respiratory:  Negative for cough and stridor.   Cardiovascular:  Negative for chest pain.  Gastrointestinal:  Negative for vomiting.  Genitourinary:  Negative for dysuria.  Musculoskeletal:  Positive for myalgias (L 2nd finger).  Skin:  Negative for rash.  Neurological:  Negative for seizures, loss of consciousness and headaches.  Psychiatric/Behavioral:  Negative for suicidal ideas.   All other systems reviewed and are negative.    ____________________________________________   PHYSICAL EXAM:  VITAL SIGNS: ED Triage Vitals  Enc Vitals Group     BP 11/26/20 1130 (!) 134/100     Pulse Rate 11/26/20 1130 87     Resp 11/26/20 1130 16     Temp 11/26/20 1130 97.7 F (36.5 C)     Temp Source 11/26/20 1130 Oral     SpO2 11/26/20 1130 99 %     Weight 11/26/20 1125 190 lb (86.2 kg)     Height 11/26/20 1125 '5\' 10"'$  (1.778 m)     Head Circumference --      Peak Flow --      Pain Score 11/26/20 1125 6     Pain Loc --      Pain Edu? --      Excl. in [place]? --    Vitals:   11/26/20 1130  BP: (!) 134/100  Pulse: 87  Resp: 16  Temp: 97.7 F (36.5 C)  SpO2: 99%   Physical Exam Vitals  and nursing note reviewed.  Constitutional:      Appearance: He is well-developed.  HENT:     Head: Normocephalic and atraumatic.     Right Ear: External ear normal.     Left Ear: External ear normal.     Nose: Nose normal.  Eyes:     Conjunctiva/sclera: Conjunctivae normal.  Cardiovascular:     Rate and Rhythm: Normal rate and regular rhythm.     Heart sounds: No murmur heard. Pulmonary:     Effort: Pulmonary effort is normal. No respiratory distress.  Abdominal:     General: There is no distension.     Palpations: Abdomen is soft.  Musculoskeletal:     Cervical back: Neck supple.  Skin:    General: Skin is warm and dry.  Neurological:     Mental Status: He is alert and oriented to person, place, and time.  Psychiatric:        Mood and Affect: Mood normal.     Approximately 1.5 cm linear laceration over the radial aspect of the left second digit extending from approximately the distal interphalangeal joint to the proximal interphalangeal joint.  Does not cross joint space on the volar or dorsal aspect.  Patient is able to flex and extend the digit against resistance of both these joints.  There is no other injury to this digit or the extremities.  Distal capillary refill in the second digit is less than 2 seconds. ____________________________________________   LABS (all labs ordered are listed, but only abnormal results are displayed)  Labs Reviewed - No data to display ____________________________________________  EKG   ____________________________________________  RADIOLOGY  ED MD interpretation:    Official radiology report(s): No results found.  ____________________________________________   PROCEDURES  Procedure(s) performed (including Critical Care):  Marland KitchenMarland KitchenLaceration Repair  Date/Time: 11/26/2020 12:29 PM Performed by: Lucrezia Starch, MD Authorized by: Lucrezia Starch, MD   Consent:    Consent obtained:  Verbal   Consent given by:  Patient   Risks, benefits, and alternatives were discussed: yes     Risks discussed:  Pain, infection and need for additional repair Universal protocol:    Procedure explained and questions answered to patient or proxy's satisfaction: yes   Laceration details:    Location:  Finger   Length (cm):  1.5 Exploration:    Limited defect created (wound extended): no     Hemostasis achieved with:  Tourniquet   Wound exploration: entire depth of wound visualized     Contaminated: no   Treatment:    Area cleansed with:  Soap and water   Amount of cleaning:  Extensive   Irrigation solution:  Tap water   Irrigation method:  Tap   Visualized foreign bodies/material removed: no     Debridement:  None   Undermining:  None   Scar revision: no   Skin repair:    Repair method:  Sutures   Suture  size:  5-0   Suture technique:  Simple interrupted   Number of sutures:  4 Approximation:    Approximation:  Close Repair type:    Repair type:  Simple Post-procedure details:    Dressing:  Adhesive bandage   Procedure completion:  Tolerated well, no immediate complications   ____________________________________________   INITIAL IMPRESSION / ASSESSMENT AND PLAN / ED COURSE      Patient presents with above to history exam for a laceration he sustained to his left second digit with a razor prior to arrival.  No other  injuries per patient.  He is afebrile hemodynamically stable arrival.  On exam is approximate 1.5 cm laceration that was repaired per procedure note above.  There is no evidence of tendon injury is able to flex and extend without difficulty.  His finger otherwise appears well perfused.  Suspicion for occult fracture or other significant injury.  Tetanus updated.  Discharged stable condition.  Strict return precautions advised and discussed.         ____________________________________________   FINAL CLINICAL IMPRESSION(S) / ED DIAGNOSES  Final diagnoses:  Laceration of left index finger without foreign body without damage to nail, initial encounter    Medications  lidocaine (PF) (XYLOCAINE) 1 % injection 5 mL (5 mLs Other Given by Other 11/26/20 1135)  Tdap (BOOSTRIX) injection 0.5 mL (0.5 mLs Intramuscular Given 11/26/20 1152)     ED Discharge Orders     None        Note:  This document was prepared using Dragon voice recognition software and may include unintentional dictation errors.    Lucrezia Starch, MD 11/26/20 1230

## 2022-01-18 ENCOUNTER — Ambulatory Visit: Payer: Managed Care, Other (non HMO) | Admitting: Dermatology

## 2022-01-18 DIAGNOSIS — D1723 Benign lipomatous neoplasm of skin and subcutaneous tissue of right leg: Secondary | ICD-10-CM

## 2022-01-18 DIAGNOSIS — D492 Neoplasm of unspecified behavior of bone, soft tissue, and skin: Secondary | ICD-10-CM

## 2022-01-18 NOTE — Patient Instructions (Addendum)
Wound Care Instructions  Cleanse wound gently with soap and water once a day then pat dry with clean gauze. Apply a thin coat of Petrolatum (petroleum jelly, "Vaseline") over the wound (unless you have an allergy to this). We recommend that you use a new, sterile tube of Vaseline. Do not pick or remove scabs. Do not remove the yellow or white "healing tissue" from the base of the wound.  Cover the wound with fresh, clean, nonstick gauze and secure with paper tape. You may use Band-Aids in place of gauze and tape if the wound is small enough, but would recommend trimming much of the tape off as there is often too much. Sometimes Band-Aids can irritate the skin.  You should call the office for your biopsy report after 1 week if you have not already been contacted.  If you experience any problems, such as abnormal amounts of bleeding, swelling, significant bruising, significant pain, or evidence of infection, please call the office immediately.  FOR ADULT SURGERY PATIENTS: If you need something for pain relief you may take 1 extra strength Tylenol (acetaminophen) AND 2 Ibuprofen (200mg each) together every 4 hours as needed for pain. (do not take these if you are allergic to them or if you have a reason you should not take them.) Typically, you may only need pain medication for 1 to 3 days.     Due to recent changes in healthcare laws, you may see results of your pathology and/or laboratory studies on MyChart before the doctors have had a chance to review them. We understand that in some cases there may be results that are confusing or concerning to you. Please understand that not all results are received at the same time and often the doctors may need to interpret multiple results in order to provide you with the best plan of care or course of treatment. Therefore, we ask that you please give us 2 business days to thoroughly review all your results before contacting the office for clarification. Should  we see a critical lab result, you will be contacted sooner.   If You Need Anything After Your Visit  If you have any questions or concerns for your doctor, please call our main line at [phone] and press option 4 to reach your doctor's medical assistant. If no one answers, please leave a voicemail as directed and we will return your call as soon as possible. Messages left after 4 pm will be answered the following business day.   You may also send us a message via MyChart. We typically respond to MyChart messages within 1-2 business days.  For prescription refills, please ask your pharmacy to contact our office. Our fax number is [phone].  If you have an urgent issue when the clinic is closed that cannot wait until the next business day, you can page your doctor at the number below.    Please note that while we do our best to be available for urgent issues outside of office hours, we are not available 24/7.   If you have an urgent issue and are unable to reach us, you may choose to seek medical care at your doctor's office, retail clinic, urgent care center, or emergency room.  If you have a medical emergency, please immediately call 911 or go to the emergency department.  Pager Numbers  - Dr. Kowalski: [phone]  - Dr. Moye: [phone]  - Dr. Stewart: [phone]  In the event of inclement weather, please call our main line at   [phone] for an update on the status of any delays or closures.  Dermatology Medication Tips: Please keep the boxes that topical medications come in in order to help keep track of the instructions about where and how to use these. Pharmacies typically print the medication instructions only on the boxes and not directly on the medication tubes.   If your medication is too expensive, please contact our office at [phone] option 4 or send us a message through MyChart.   We are unable to tell what your co-pay for medications will be in  advance as this is different depending on your insurance coverage. However, we may be able to find a substitute medication at lower cost or fill out paperwork to get insurance to cover a needed medication.   If a prior authorization is required to get your medication covered by your insurance company, please allow us 1-2 business days to complete this process.  Drug prices often vary depending on where the prescription is filled and some pharmacies may offer cheaper prices.  The website www.goodrx.com contains coupons for medications through different pharmacies. The prices here do not account for what the cost may be with help from insurance (it may be cheaper with your insurance), but the website can give you the price if you did not use any insurance.  - You can print the associated coupon and take it with your prescription to the pharmacy.  - You may also stop by our office during regular business hours and pick up a GoodRx coupon card.  - If you need your prescription sent electronically to a different pharmacy, notify our office through [facility] MyChart or by phone at [phone] option 4.     Si Usted Necesita Algo Despus de Su Visita  Tambin puede enviarnos un mensaje a travs de MyChart. Por lo general respondemos a los mensajes de MyChart en el transcurso de 1 a 2 das hbiles.  Para renovar recetas, por favor pida a su farmacia que se ponga en contacto con nuestra oficina. Nuestro nmero de fax es el [phone].  Si tiene un asunto urgente cuando la clnica est cerrada y que no puede esperar hasta el siguiente da hbil, puede llamar/localizar a su doctor(a) al nmero que aparece a continuacin.   Por favor, tenga en cuenta que aunque hacemos todo lo posible para estar disponibles para asuntos urgentes fuera del horario de oficina, no estamos disponibles las 24 horas del da, los 7 das de la semana.   Si tiene un problema urgente y no puede comunicarse con nosotros, puede  optar por buscar atencin mdica  en el consultorio de su doctor(a), en una clnica privada, en un centro de atencin urgente o en una sala de emergencias.  Si tiene una emergencia mdica, por favor llame inmediatamente al 911 o vaya a la sala de emergencias.  Nmeros de bper  - Dr. Kowalski: [phone]  - Dra. Moye: [phone]  - Dra. Stewart: [phone]  En caso de inclemencias del tiempo, por favor llame a nuestra lnea principal al [phone] para una actualizacin sobre el estado de cualquier retraso o cierre.  Consejos para la medicacin en dermatologa: Por favor, guarde las cajas en las que vienen los medicamentos de uso tpico para ayudarle a seguir las instrucciones sobre dnde y cmo usarlos. Las farmacias generalmente imprimen las instrucciones del medicamento slo en las cajas y no directamente en los tubos del medicamento.   Si su medicamento es muy caro, por favor, pngase en contacto con   nuestra oficina llamando al [phone] y presione la opcin 4 o envenos un mensaje a travs de MyChart.   No podemos decirle cul ser su copago por los medicamentos por adelantado ya que esto es diferente dependiendo de la cobertura de su seguro. Sin embargo, es posible que podamos encontrar un medicamento sustituto a menor costo o llenar un formulario para que el seguro cubra el medicamento que se considera necesario.   Si se requiere una autorizacin previa para que su compaa de seguros cubra su medicamento, por favor permtanos de 1 a 2 das hbiles para completar este proceso.  Los precios de los medicamentos varan con frecuencia dependiendo del lugar de dnde se surte la receta y alguna farmacias pueden ofrecer precios ms baratos.  El sitio web www.goodrx.com tiene cupones para medicamentos de diferentes farmacias. Los precios aqu no tienen en cuenta lo que podra costar con la ayuda del seguro (puede ser ms barato con su seguro), pero el sitio web puede darle el  precio si no utiliz ningn seguro.  - Puede imprimir el cupn correspondiente y llevarlo con su receta a la farmacia.  - Tambin puede pasar por nuestra oficina durante el horario de atencin regular y recoger una tarjeta de cupones de GoodRx.  - Si necesita que su receta se enve electrnicamente a una farmacia diferente, informe a nuestra oficina a travs de MyChart de [facility] o por telfono llamando al [phone] y presione la opcin 4.  

## 2022-01-18 NOTE — Progress Notes (Signed)
   New Patient Visit  Subjective  Brian Perez is a 51 y.o. male who presents for the following: Skin Tag (R leg, ~95yr, growing). New patient referral from Dr. JMaryland Pink  The following portions of the chart were reviewed this encounter and updated as appropriate:   Tobacco  Allergies  Meds  Problems  Med Hx  Surg Hx  Fam Hx     Review of Systems:  No other skin or systemic complaints except as noted in HPI or Assessment and Plan.  Objective  Well appearing patient in no apparent distress; mood and affect are within normal limits.  A focused examination was performed including right leg. Relevant physical exam findings are noted in the Assessment and Plan.  R post thigh Fleshy pedunculated pap   Assessment & Plan  Neoplasm of skin R post thigh  Skin excision  Lesion length (cm):  2.2 Lesion width (cm):  2.2 Margin per side (cm):  0 Total excision diameter (cm):  2.2 Informed consent: discussed and consent obtained   Timeout: patient name, date of birth, surgical site, and procedure verified   Procedure prep:  Patient was prepped and draped in usual sterile fashion Prep type:  Isopropyl alcohol and povidone-iodine Anesthesia: the lesion was anesthetized in a standard fashion   Anesthetic:  1% lidocaine w/ epinephrine 1-100,000 buffered w/ 8.4% NaHCO3 Instrument used: #15 blade   Hemostasis achieved with: pressure   Hemostasis achieved with comment:  Electrocautery Outcome: patient tolerated procedure well with no complications   Post-procedure details: sterile dressing applied and wound care instructions given   Dressing type: bandage, pressure dressing and bacitracin (Mupirocin)   Additional details:  Simple excision today  Specimen 1 - Surgical pathology 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  I, SOthelia Pulling RMA, am acting as scribe for  DSarina Ser MD . Documentation: I have reviewed the above documentation for accuracy and completeness, and I agree with the above.  DSarina Ser MD

## 2022-01-24 ENCOUNTER — Telehealth: Payer: Self-pay

## 2022-01-24 NOTE — Telephone Encounter (Signed)
Advised pt of bx result/sh ?

## 2022-01-24 NOTE — Telephone Encounter (Signed)
-----   Message from Ralene Bathe, MD sent at 01/24/2022  2:48 PM EST ----- Diagnosis Skin (M), right post thigh FIBROLIPOMA   Benign fatty mole No further treatment needed

## 2022-02-03 ENCOUNTER — Encounter: Payer: Self-pay | Admitting: Dermatology

## 2022-07-31 ENCOUNTER — Other Ambulatory Visit: Payer: Self-pay

## 2022-07-31 ENCOUNTER — Emergency Department
Admission: EM | Admit: 2022-07-31 | Discharge: 2022-07-31 | Disposition: A | Discharge status: Home / Self Care | Payer: Managed Care, Other (non HMO) | Attending: Emergency Medicine | Admitting: Emergency Medicine

## 2022-07-31 ENCOUNTER — Emergency Department: Payer: Managed Care, Other (non HMO)

## 2022-07-31 DIAGNOSIS — R55 Syncope and collapse: Secondary | ICD-10-CM

## 2022-07-31 DIAGNOSIS — R569 Unspecified convulsions: Secondary | ICD-10-CM | POA: Diagnosis not present

## 2022-07-31 LAB — URINE DRUG SCREEN, QUALITATIVE (ARMC ONLY)
Amphetamines, Ur Screen: NOT DETECTED
Barbiturates, Ur Screen: NOT DETECTED
Benzodiazepine, Ur Scrn: NOT DETECTED
Cannabinoid 50 Ng, Ur ~~LOC~~: NOT DETECTED
Cocaine Metabolite,Ur ~~LOC~~: NOT DETECTED
MDMA (Ecstasy)Ur Screen: NOT DETECTED
Methadone Scn, Ur: NOT DETECTED
Opiate, Ur Screen: NOT DETECTED
Phencyclidine (PCP) Ur S: NOT DETECTED
Tricyclic, Ur Screen: NOT DETECTED

## 2022-07-31 LAB — URINALYSIS, MICROSCOPIC (REFLEX)
Bacteria, UA: NONE SEEN
Squamous Epithelial / HPF: NONE SEEN /HPF (ref 0–5)

## 2022-07-31 LAB — URINALYSIS, ROUTINE W REFLEX MICROSCOPIC
Bilirubin Urine: NEGATIVE
Glucose, UA: NEGATIVE mg/dL
Ketones, ur: NEGATIVE mg/dL
Leukocytes,Ua: NEGATIVE
Nitrite: NEGATIVE
Protein, ur: NEGATIVE mg/dL
Specific Gravity, Urine: 1.015 (ref 1.005–1.030)
pH: 5.5 (ref 5.0–8.0)

## 2022-07-31 LAB — CBC WITH DIFFERENTIAL/PLATELET
Abs Immature Granulocytes: 0.14 K/uL — ABNORMAL HIGH (ref 0.00–0.07)
Basophils Absolute: 0.1 K/uL (ref 0.0–0.1)
Basophils Relative: 1 %
Eosinophils Absolute: 0.2 K/uL (ref 0.0–0.5)
Eosinophils Relative: 2 %
HCT: 45.5 % (ref 39.0–52.0)
Hemoglobin: 15.6 g/dL (ref 13.0–17.0)
Immature Granulocytes: 2 %
Lymphocytes Relative: 33 %
Lymphs Abs: 2.2 K/uL (ref 0.7–4.0)
MCH: 28.6 pg (ref 26.0–34.0)
MCHC: 34.3 g/dL (ref 30.0–36.0)
MCV: 83.5 fL (ref 80.0–100.0)
Monocytes Absolute: 0.4 K/uL (ref 0.1–1.0)
Monocytes Relative: 6 %
Neutro Abs: 3.7 K/uL (ref 1.7–7.7)
Neutrophils Relative %: 56 %
Platelets: 217 K/uL (ref 150–400)
RBC: 5.45 MIL/uL (ref 4.22–5.81)
RDW: 12.6 % (ref 11.5–15.5)
WBC: 6.7 K/uL (ref 4.0–10.5)
nRBC: 0 % (ref 0.0–0.2)

## 2022-07-31 LAB — COMPREHENSIVE METABOLIC PANEL WITH GFR
ALT: 30 U/L (ref 0–44)
AST: 34 U/L (ref 15–41)
Albumin: 4.2 g/dL (ref 3.5–5.0)
Alkaline Phosphatase: 50 U/L (ref 38–126)
Anion gap: 9 (ref 5–15)
BUN: 14 mg/dL (ref 6–20)
CO2: 23 mmol/L (ref 22–32)
Calcium: 9 mg/dL (ref 8.9–10.3)
Chloride: 105 mmol/L (ref 98–111)
Creatinine, Ser: 1.03 mg/dL (ref 0.61–1.24)
GFR, Estimated: >60 mL/min
Glucose, Bld: 149 mg/dL — ABNORMAL HIGH (ref 70–99)
Potassium: 4.1 mmol/L (ref 3.5–5.1)
Sodium: 137 mmol/L (ref 135–145)
Total Bilirubin: 0.8 mg/dL (ref 0.3–1.2)
Total Protein: 7.3 g/dL (ref 6.5–8.1)

## 2022-07-31 LAB — MAGNESIUM: Magnesium: 1.7 mg/dL (ref 1.7–2.4)

## 2022-07-31 MED ORDER — CYCLOBENZAPRINE HCL 10 MG PO TABS: 10.0000 mg | ORAL_TABLET | Freq: Three times a day (TID) | ORAL | 0 refills | Status: AC | PRN

## 2022-07-31 MED ORDER — SODIUM CHLORIDE 0.9 % IV BOLUS
1000.0000 mL | Freq: Once | INTRAVENOUS | Status: AC
  Administered 2022-07-31: 1000 mL via INTRAVENOUS

## 2022-07-31 NOTE — ED Triage Notes (Signed)
Pt is coming from work via [employer] with a seizure. According to EMS, the patient had a witnessed seizure while at work that lasted about 5 minutes. Pt was post ictal when ems arrived, ems states that patient started being more responsive halfway to the hospital. Pt did bite his tongue.Pt has no history of seizures. Pt is alert and oriented x4, with some moments of confusions, and overall weakness. Pt complains of a headache with pain rating 5/10. Pt's wife and daughter are at the bedside.  Pt with no signs of distress at  this time.

## 2022-07-31 NOTE — Discharge Instructions (Addendum)
You were seen in the ER today after a seizure-like episode. We do not typically start someone on medicine for seizures after a single episode. However, it is important that you contact a neurologist to arrange follow-up for further evaluation of your episode.  Please do not drive, swim or bathe unsupervised, or climb to heights for six months or until otherwise instructed by your doctor. Return to the ER for recurrent seizures or for other new or concerning symptoms.   I have sent a couple days of a prescription for muscle relaxer that you can take as needed for muscle tightness.  This can make you drowsy, do not drive or operate machinery when taking this.

## 2022-07-31 NOTE — ED Provider Notes (Signed)
[facility] Provider Note    Event Date/Time   First MD Initiated Contact with Patient 07/31/22 0831     (approximate)   History   Seizures   HPI  Brian Perez is a 52 y.o. male with history of migraines presenting to the emergency department for evaluation following a witnessed seizure-like episode.  [place] when he had a witnessed episode of generalized shaking that reportedly lasted for about 5 minutes.  Patient did bite his tongue.  Did not receive any medications.  Was confused following the event, but started to wake up and route with EMS.  No history of similar.  Reports mild headache yesterday, not sudden in onset, not different than prior, otherwise reports feeling well recently.  No recent fevers.  No chest pain or shortness of breath.  Patient remembers waking up this morning, but does not have any memory of the event.     Physical Exam   Triage Vital Signs: ED Triage Vitals  Enc Vitals Group     BP 07/31/22 0839 (!) 146/97     Pulse Rate 07/31/22 0839 91     Resp 07/31/22 0839 12     Temp 07/31/22 0839 98.1 F (36.7 C)     Temp Source 07/31/22 0839 Oral     SpO2 07/31/22 0830 100 %     Weight 07/31/22 0831 206 lb 9.1 oz (93.7 kg)     Height 07/31/22 0831 5\' 10"  (1.778 m)     Head Circumference --      Peak Flow --      Pain Score 07/31/22 0831 0     Pain Loc --      Pain Edu? --      Excl. in GC? --     Most recent vital signs: Vitals:   07/31/22 0830 07/31/22 0839  BP:  (!) 146/97  Pulse:  91  Resp:  12  Temp:  98.1 F (36.7 C)  SpO2: 100% 100%     General: Awake, interactive  HEENT: Pupils equal and reactive, there are contusions along the bilateral lateral aspect of the tongue without associated gaping or active bleeding. CV:  Regular rate, good peripheral perfusion.  Resp:  Lungs clear, unlabored respirations.  Abd:  Soft, nondistended.  Neuro:  Alert and  oriented, normal extraocular movements, symmetric facial movement, sensation intact over bilateral upper and lower extremities with 5 out of 5 strength.  Normal finger-to-nose testing.   ED Results / Procedures / Treatments   Labs (all labs ordered are listed, but only abnormal results are displayed) Labs Reviewed  COMPREHENSIVE METABOLIC PANEL - Abnormal; Notable for the following components:      Result Value   Glucose, Bld 149 (*)    All other components within normal limits  CBC WITH DIFFERENTIAL/PLATELET - Abnormal; Notable for the following components:   Abs Immature Granulocytes 0.14 (*)    All other components within normal limits  URINALYSIS, ROUTINE W REFLEX MICROSCOPIC - Abnormal; Notable for the following components:   Hgb urine dipstick TRACE (*)    All other components within normal limits  MAGNESIUM  URINE DRUG SCREEN, QUALITATIVE ([facility] ONLY)  URINALYSIS, MICROSCOPIC (REFLEX)     EKG EKG independently reviewed interpreted by myself (ER attending) demonstrates:  Sinus rhythm at a rate of 85, PR 144, QRS 108, QTc 473, left anterior fascicular block   RADIOLOGY Imaging independently reviewed and interpreted by myself demonstrates:  Head CT without acute bleed  PROCEDURES:  Critical Care performed: No  Procedures   MEDICATIONS ORDERED IN ED: Medications  sodium chloride 0.9 % bolus 1,000 mL (0 mLs Intravenous Stopped 07/31/22 1156)     IMPRESSION / MDM / ASSESSMENT AND PLAN / ED COURSE  I reviewed the triage vital signs and the nursing notes.  Differential diagnosis includes, but is not limited to, electrolyte abnormality, intracranial bleed, new onset primary seizure disorder, nonepileptic seizure, convulsive syncope  Patient's presentation is most consistent with acute presentation with potential threat to life or bodily function.  52 year old male presenting with first-time seizure-like episode.  Will obtain labs, head CT, EKG.  Fortunately appears to  be back at his neurologic baseline currently.  Lab work including CBC, CMP, mag, urinalysis and UDS reassuring.  Head CT without acute abnormality.  Patient reevaluated and is without further seizure-like activity.  With single episode, do think patient is appropriate for outpatient follow-up.  Discussed with patient who is comfortable with this plan.  Seizure precautions provided.  Patient discharged stable condition.      FINAL CLINICAL IMPRESSION(S) / ED DIAGNOSES   Final diagnoses:  Seizure-like activity (HCC)     Rx / DC Orders   ED Discharge Orders          Ordered    cyclobenzaprine (FLEXERIL) 10 MG tablet  3 times daily PRN        07/31/22 1133             Note:  This document was prepared using Dragon voice recognition software and may include unintentional dictation errors.   Trinna Post, MD 07/31/22 [id number]

## 2023-09-05 ENCOUNTER — Ambulatory Visit: Admitting: Urology

## 2023-09-05 ENCOUNTER — Encounter: Payer: Self-pay | Admitting: Urology

## 2023-09-05 VITALS — BP 143/95 | HR 83 | Ht 70.0 in | Wt 201.0 lb

## 2023-09-05 DIAGNOSIS — N529 Male erectile dysfunction, unspecified: Secondary | ICD-10-CM

## 2023-09-05 MED ORDER — TADALAFIL 20 MG PO TABS: ORAL_TABLET | ORAL | 0 refills | Status: AC

## 2023-09-05 NOTE — Progress Notes (Incomplete)
 I, Maysun Jamey Mccallum, acting as a scribe for Brian Knapp, MD., have documented all relevant documentation on the behalf of Brian Knapp, MD, as directed by Brian Knapp, MD while in the presence of Brian Knapp, MD.  09/05/2023 3:33 PM   Brian F Coca 1970/08/19 [id number]  Referring provider: Lyle San, MD [address] [facility] [place],  [place] [address]  Chief Complaint  Patient presents with   Erectile Dysfunction    HPI: Brian Perez is a 53 y.o. male referred for evaluation of erectile dysfunction.  Several-month history of difficulty achieving and maintaining erection. Significant organic risk factors. No prior history of tobacco use Does have some tiredness and fatigue, but attributes this to his work schedule. Libido good History of mild hyperlipidemia not on management. No prior treatment for his erectile dysfunction.  He had a total testosterone  level which was normal at 395. A free testosterone  level was drawn which was elevated at 50 pg/mL No symptoms of hypotestosteronism.   Surgical History: Past Surgical History:  Procedure Laterality Date   HERNIA REPAIR      Home Medications:  Allergies as of 09/05/2023   No Known Allergies      Medication List        Accurate as of September 05, 2023  3:33 PM. If you have any questions, ask your nurse or doctor.          ALPRAZolam 1 MG tablet Commonly known as: XANAX Take 1 tablet 30 minutes prior to procedure.   cetirizine 10 MG tablet Commonly known as: ZYRTEC Take 10 mg by mouth.   HYDROcodone -acetaminophen  5-325 MG tablet Commonly known as: NORCO/VICODIN Take 1 tablet by mouth every 4 (four) hours as needed for moderate pain.   meloxicam  15 MG tablet Commonly known as: MOBIC  Take 1 tablet (15 mg total) by mouth daily.   methocarbamol  500 MG tablet Commonly known as: Robaxin  Take 1 tablet (500 mg total) by mouth 4 (four) times daily.   tadalafil 20 MG tablet Commonly  known as: CIALIS Take 1 tab 1 hour prior to intercourse Started by: Brian Perez        Allergies: No Known Allergies  Social History:  reports that he has never smoked. He does not have any smokeless tobacco history on file. He reports that he does not drink alcohol. No history on file for drug use.   Physical Exam: BP (!) 143/95   Pulse 83   Ht 5' 10 (1.778 m)   Wt 201 lb (91.2 kg)   BMI 28.84 kg/m   Constitutional:  Alert and oriented, No acute distress. HEENT: [place] AT, moist mucus membranes.  Trachea midline, no masses. Cardiovascular: No clubbing, cyanosis, or edema. Respiratory: Normal respiratory effort, no increased work of breathing. GI: Abdomen is soft, nontender, nondistended, no abdominal masses GU: Phallus without lesions. Testes descended bilaterally without masses or tenderness Skin: No rashes, bruises or suspicious lesions. Neurologic: Grossly intact, no focal deficits, moving all 4 extremities. Psychiatric: Normal mood and affect.   Assessment & Plan:    1. Erectal dysfunction Discussed that new onset ED without significant organic risk factors can be an early warning sign of heart disease. Recommended considering a CT cardiac score to evaluate for potential cardiac issues. Total testosterone  level is normal at 395 ng/dL. Free testosterone  level was elevated at 50 pg/mL, but this is not considered reliable for evaluation of hypogonadism. No symptoms of hypertestosteronism present, and the  elevated free testosterone  is likely a false positive. Recommended trial of Tadalafil (Cialis) 20 mg, to be taken as needed before intercourse. Discussed the lower side effect profile compared to Viagra and the longer duration of action (up to 36 hours). Advised the patient to monitor for any side effects and to call back if the medication is not effective.  [facility] [address] [place], [place] [address] [phone]

## 2023-09-09 ENCOUNTER — Encounter: Payer: Self-pay | Admitting: Urology
